# Patient Record
Sex: Female | Born: 1960 | Race: Black or African American | Hispanic: No | Marital: Married | Smoking: Current every day smoker
Health system: Southern US, Community
[De-identification: ages and names within clinical notes are randomized; demographics above are authoritative.]

## PROBLEM LIST (undated history)

## (undated) DIAGNOSIS — I10 Essential (primary) hypertension: Secondary | ICD-10-CM

---

## 1998-10-23 HISTORY — PX: BREAST BIOPSY: SHX20

## 2010-12-08 ENCOUNTER — Ambulatory Visit: Payer: Self-pay | Admitting: Family Medicine

## 2010-12-14 ENCOUNTER — Ambulatory Visit: Payer: Self-pay | Admitting: Family Medicine

## 2015-07-12 ENCOUNTER — Other Ambulatory Visit: Payer: Self-pay

## 2015-07-12 ENCOUNTER — Ambulatory Visit
Admission: EM | Admit: 2015-07-12 | Discharge: 2015-07-12 | Disposition: A | Discharge status: Home / Self Care | Payer: BLUE CROSS/BLUE SHIELD | Attending: Family Medicine | Admitting: Family Medicine

## 2015-07-12 DIAGNOSIS — R002 Palpitations: Secondary | ICD-10-CM

## 2015-07-12 DIAGNOSIS — R001 Bradycardia, unspecified: Secondary | ICD-10-CM | POA: Diagnosis not present

## 2015-07-12 DIAGNOSIS — I10 Essential (primary) hypertension: Secondary | ICD-10-CM | POA: Diagnosis not present

## 2015-07-12 DIAGNOSIS — Z79899 Other long term (current) drug therapy: Secondary | ICD-10-CM | POA: Insufficient documentation

## 2015-07-12 DIAGNOSIS — R112 Nausea with vomiting, unspecified: Secondary | ICD-10-CM | POA: Diagnosis not present

## 2015-07-12 DIAGNOSIS — F17210 Nicotine dependence, cigarettes, uncomplicated: Secondary | ICD-10-CM | POA: Insufficient documentation

## 2015-07-12 HISTORY — DX: Essential (primary) hypertension: I10

## 2015-07-12 LAB — CBC WITH DIFFERENTIAL/PLATELET
Basophils Absolute: 0.1 10*3/uL (ref 0–0.1)
Basophils Relative: 1 %
EOS ABS: 0.1 10*3/uL (ref 0–0.7)
Eosinophils Relative: 1 %
HEMATOCRIT: 40.2 % (ref 35.0–47.0)
HEMOGLOBIN: 13.9 g/dL (ref 12.0–16.0)
LYMPHS ABS: 1.5 10*3/uL (ref 1.0–3.6)
LYMPHS PCT: 25 %
MCH: 33.5 pg (ref 26.0–34.0)
MCHC: 34.5 g/dL (ref 32.0–36.0)
MCV: 97.2 fL (ref 80.0–100.0)
MONOS PCT: 6 %
Monocytes Absolute: 0.4 10*3/uL (ref 0.2–0.9)
NEUTROS ABS: 4.1 10*3/uL (ref 1.4–6.5)
NEUTROS PCT: 67 %
Platelets: 230 10*3/uL (ref 150–440)
RBC: 4.14 MIL/uL (ref 3.80–5.20)
RDW: 12.8 % (ref 11.5–14.5)
WBC: 6.2 10*3/uL (ref 3.6–11.0)

## 2015-07-12 LAB — COMPREHENSIVE METABOLIC PANEL
ALK PHOS: 110 U/L (ref 38–126)
ALT: 24 U/L (ref 14–54)
ANION GAP: 13 (ref 5–15)
AST: 28 U/L (ref 15–41)
Albumin: 4.3 g/dL (ref 3.5–5.0)
BILIRUBIN TOTAL: 0.8 mg/dL (ref 0.3–1.2)
BUN: 15 mg/dL (ref 6–20)
CALCIUM: 10.1 mg/dL (ref 8.9–10.3)
CO2: 23 mmol/L (ref 22–32)
CREATININE: 0.97 mg/dL (ref 0.44–1.00)
Chloride: 103 mmol/L (ref 101–111)
Glucose, Bld: 85 mg/dL (ref 65–99)
Potassium: 4.2 mmol/L (ref 3.5–5.1)
Sodium: 139 mmol/L (ref 135–145)
TOTAL PROTEIN: 8.5 g/dL — AB (ref 6.5–8.1)

## 2015-07-12 LAB — MAGNESIUM: Magnesium: 2.1 mg/dL (ref 1.7–2.4)

## 2015-07-12 MED ORDER — ONDANSETRON 8 MG PO TBDP
8.0000 mg | ORAL_TABLET | Freq: Once | ORAL | Status: AC
  Administered 2015-07-12: 8 mg via ORAL

## 2015-07-12 MED ORDER — HYDROCHLOROTHIAZIDE 12.5 MG PO TABS: ORAL_TABLET | ORAL | Status: AC

## 2015-07-12 MED ORDER — HYDROCHLOROTHIAZIDE 12.5 MG PO TABS: ORAL_TABLET | ORAL | Status: DC

## 2015-07-12 MED ORDER — AMLODIPINE BESYLATE 5 MG PO TABS: ORAL_TABLET | ORAL | Status: AC

## 2015-07-12 MED ORDER — ONDANSETRON 8 MG PO TBDP: 8.0000 mg | ORAL_TABLET | Freq: Three times a day (TID) | ORAL | Status: AC | PRN

## 2015-07-12 MED ORDER — AMLODIPINE BESYLATE 10 MG PO TABS: ORAL_TABLET | ORAL | Status: DC

## 2015-07-12 NOTE — Discharge Instructions (Signed)
Bradycardia Bradycardia is a term for a heart rate (pulse) that, in adults, is slower than 60 beats per minute. A normal rate is 60 to 100 beats per minute. A heart rate below 60 beats per minute may be normal for some adults with healthy hearts. If the rate is too slow, the heart may have trouble pumping the volume of blood the body needs. If the heart rate gets too low, blood flow to the brain may be decreased and may make you feel lightheaded, dizzy, or faint. The heart has a natural pacemaker in the top of the heart called the SA node (sinoatrial or sinus node). This pacemaker sends out regular electrical signals to the muscle of the heart, telling the heart muscle when to beat (contract). The electrical signal travels from the upper parts of the heart (atria) through the AV node (atrioventricular node), to the lower chambers of the heart (ventricles). The ventricles squeeze, pumping the blood from your heart to your lungs and to the rest of your body. CAUSES   Problem with the heart's electrical system.  Problem with the heart's natural pacemaker.  Heart disease, damage, or infection.  Medications.  Problems with minerals and salts (electrolytes). SYMPTOMS   Fainting (syncope).  Fatigue and weakness.  Shortness of breath (dyspnea).  Chest pain (angina).  Drowsiness.  Confusion. DIAGNOSIS   An electrocardiogram (ECG) can help your caregiver determine the type of slow heart rate you have.  If the cause is not seen on an ECG, you may need to wear a heart monitor that records your heart rhythm for several hours or days.  Blood tests. TREATMENT   Electrolyte supplements.  Medications.  Withholding medication which is causing a slow heart rate.  Pacemaker placement. SEEK IMMEDIATE MEDICAL CARE IF:   You feel lightheaded or faint.  You develop an irregular heart rate.  You feel chest pain or have trouble breathing. MAKE SURE YOU:   Understand these  instructions.  Will watch your condition.  Will get help right away if you are not doing well or get worse. Document Released: 07/01/2002 Document Revised: 01/01/2012 Document Reviewed: 01/14/2014 [facility] Patient Information 2015 [place], [place]. This information is not intended to replace advice given to you by your health care provider. Make sure you discuss any questions you have with your health care provider.  How to Take Your Blood Pressure HOW DO I GET A BLOOD PRESSURE MACHINE?  You can buy an electronic home blood pressure machine at your local pharmacy. Insurance will sometimes cover the cost if you have a prescription.  Ask your doctor what type of machine is best for you. There are different machines for your arm and your wrist.  If you decide to buy a machine to check your blood pressure on your arm, first check the size of your arm so you can buy the right size cuff. To check the size of your arm:   Use a measuring tape that shows both inches and centimeters.   Wrap the measuring tape around the upper-middle part of your arm. You may need someone to help you measure.   Write down your arm measurement in both inches and centimeters.   To measure your blood pressure correctly, it is important to have the right size cuff.   If your arm is up to 13 inches (up to 34 centimeters), get an adult cuff size.  If your arm is 13 to 17 inches (35 to 44 centimeters), get a large adult cuff size.  If your arm is 17 to 20 inches (45 to 52 centimeters), get an adult thigh cuff.  WHAT DO THE NUMBERS MEAN?   There are two numbers that make up your blood pressure. For example: 120/80.  The first number (120 in our example) is called the "systolic pressure." It is a measure of the pressure in your blood vessels when your heart is pumping blood.  The second number (80 in our example) is called the "diastolic pressure." It is a measure of the pressure in your blood vessels when  your heart is resting between beats.  Your doctor will tell you what your blood pressure should be. WHAT SHOULD I DO BEFORE I CHECK MY BLOOD PRESSURE?   Try to rest or relax for at least 30 minutes before you check your blood pressure.  Do not smoke.  Do not have any drinks with caffeine, such as:  Soda.  Coffee.  Tea.  Check your blood pressure in a quiet room.  Sit down and stretch out your arm on a table. Keep your arm at about the level of your heart. Let your arm relax.  Make sure that your legs are not crossed. HOW DO I CHECK MY BLOOD PRESSURE?  Follow the directions that came with your machine.  Make sure you remove any tight-fighting clothing from your arm or wrist. Wrap the cuff around your upper arm or wrist. You should be able to fit a finger between the cuff and your arm. If you cannot fit a finger between the cuff and your arm, it is too tight and should be removed and rewrapped.  Some units require you to manually pump up the arm cuff.  Automatic units inflate the cuff when you press a button.  Cuff deflation is automatic in both models.  After the cuff is inflated, the unit measures your blood pressure and pulse. The readings are shown on a monitor. Hold still and breathe normally while the cuff is inflated.  Getting a reading takes less than a minute.  Some models store readings in a memory. Some provide a printout of readings. If your machine does not store your readings, keep a written record.  Take readings with you to your next visit with your doctor. Document Released: 09/21/2008 Document Revised: 02/23/2014 Document Reviewed: 12/04/2013 [facility] Patient Information 2015 [place], [place]. This information is not intended to replace advice given to you by your health care provider. Make sure you discuss any questions you have with your health care provider.  Hypertension Hypertension is another name for high blood pressure. High blood pressure forces  your heart to work harder to pump blood. A blood pressure reading has two numbers, which includes a higher number over a lower number (example: 110/72). HOME CARE   Have your blood pressure rechecked by your doctor.  Only take medicine as told by your doctor. Follow the directions carefully. The medicine does not work as well if you skip doses. Skipping doses also puts you at risk for problems.  Do not smoke.  Monitor your blood pressure at home as told by your doctor. GET HELP IF:  You think you are having a reaction to the medicine you are taking.  You have repeat headaches or feel dizzy.  You have puffiness (swelling) in your ankles.  You have trouble with your vision. GET HELP RIGHT AWAY IF:   You get a very bad headache and are confused.  You feel weak, numb, or faint.  You get chest or  belly (abdominal) pain.  You throw up (vomit).  You cannot breathe very well. MAKE SURE YOU:   Understand these instructions.  Will watch your condition.  Will get help right away if you are not doing well or get worse. Document Released: 03/27/2008 Document Revised: 10/14/2013 Document Reviewed: 08/01/2013 [facility] Patient Information 2015 [place], [place]. This information is not intended to replace advice given to you by your health care provider. Make sure you discuss any questions you have with your health care provider.  Managing Your High Blood Pressure Blood pressure is a measurement of how forceful your blood is pressing against the walls of the arteries. Arteries are muscular tubes within the circulatory system. Blood pressure does not stay the same. Blood pressure rises when you are active, excited, or nervous; and it lowers during sleep and relaxation. If the numbers measuring your blood pressure stay above normal most of the time, you are at risk for health problems. High blood pressure (hypertension) is a long-term (chronic) condition in which blood pressure is elevated. A  blood pressure reading is recorded as two numbers, such as 120 over 80 (or 120/80). The first, higher number is called the systolic pressure. It is a measure of the pressure in your arteries as the heart beats. The second, lower number is called the diastolic pressure. It is a measure of the pressure in your arteries as the heart relaxes between beats.  Keeping your blood pressure in a normal range is important to your overall health and prevention of health problems, such as heart disease and stroke. When your blood pressure is uncontrolled, your heart has to work harder than normal. High blood pressure is a very common condition in adults because blood pressure tends to rise with age. Men and women are equally likely to have hypertension but at different times in life. Before age 20, men are more likely to have hypertension. After 55 years of age, women are more likely to have it. Hypertension is especially common in African Americans. This condition often has no signs or symptoms. The cause of the condition is usually not known. Your caregiver can help you come up with a plan to keep your blood pressure in a normal, healthy range. BLOOD PRESSURE STAGES Blood pressure is classified into four stages: normal, prehypertension, stage 1, and stage 2. Your blood pressure reading will be used to determine what type of treatment, if any, is necessary. Appropriate treatment options are tied to these four stages:  Normal  Systolic pressure (mm Hg): below 120.  Diastolic pressure (mm Hg): below 80. Prehypertension  Systolic pressure (mm Hg): 120 to 139.  Diastolic pressure (mm Hg): 80 to 89. Stage1  Systolic pressure (mm Hg): 140 to 159.  Diastolic pressure (mm Hg): 90 to 99. Stage2  Systolic pressure (mm Hg): 160 or above.  Diastolic pressure (mm Hg): 100 or above. RISKS RELATED TO HIGH BLOOD PRESSURE Managing your blood pressure is an important responsibility. Uncontrolled high blood pressure can  lead to:  A heart attack.  A stroke.  A weakened blood vessel (aneurysm).  Heart failure.  Kidney damage.  Eye damage.  Metabolic syndrome.  Memory and concentration problems. HOW TO MANAGE YOUR BLOOD PRESSURE Blood pressure can be managed effectively with lifestyle changes and medicines (if needed). Your caregiver will help you come up with a plan to bring your blood pressure within a normal range. Your plan should include the following: Education  Read all information provided by your caregivers about how to  control blood pressure.  Educate yourself on the latest guidelines and treatment recommendations. New research is always being done to further define the risks and treatments for high blood pressure. Lifestylechanges  Control your weight.  Avoid smoking.  Stay physically active.  Reduce the amount of salt in your diet.  Reduce stress.  Control any chronic conditions, such as high cholesterol or diabetes.  Reduce your alcohol intake. Medicines  Several medicines (antihypertensive medicines) are available, if needed, to bring blood pressure within a normal range. Communication  Review all the medicines you take with your caregiver because there may be side effects or interactions.  Talk with your caregiver about your diet, exercise habits, and other lifestyle factors that may be contributing to high blood pressure.  See your caregiver regularly. Your caregiver can help you create and adjust your plan for managing high blood pressure. RECOMMENDATIONS FOR TREATMENT AND FOLLOW-UP  The following recommendations are based on current guidelines for managing high blood pressure in nonpregnant adults. Use these recommendations to identify the proper follow-up period or treatment option based on your blood pressure reading. You can discuss these options with your caregiver.  Systolic pressure of 120 to 139 or diastolic pressure of 80 to 89: Follow up with your  caregiver as directed.  Systolic pressure of 140 to 160 or diastolic pressure of 90 to 100: Follow up with your caregiver within 2 months.  Systolic pressure above 160 or diastolic pressure above 100: Follow up with your caregiver within 1 month.  Systolic pressure above 180 or diastolic pressure above 110: Consider antihypertensive therapy; follow up with your caregiver within 1 week.  Systolic pressure above 200 or diastolic pressure above 120: Begin antihypertensive therapy; follow up with your caregiver within 1 week. Document Released: 07/03/2012 Document Reviewed: 07/03/2012 [facility] Patient Information 2015 [place], [place]. This information is not intended to replace advice given to you by your health care provider. Make sure you discuss any questions you have with your health care provider.

## 2015-07-12 NOTE — ED Notes (Signed)
Pt states my nausea is better, I just feel hungry now.

## 2015-07-12 NOTE — ED Notes (Signed)
Pt states "I haven't taken my blood pressure since March. I been exercising and cutting back the salt, it was been in the 140's. Yesterday I had a bad day and I developed a headache and vomiting. Today I still have a headache, and my blood pressure at home last night was 175/112.  I have not checked it this morning.

## 2015-07-12 NOTE — ED Provider Notes (Signed)
CSN: [id number]     Arrival date & time 07/12/15  0859 History   First MD Initiated Contact with Patient 07/12/15 1007     Chief Complaint  Patient presents with  . Hypertension   (Consider location/radiation/quality/duration/timing/severity/associated sxs/prior Treatment) Patient is a 54 y.o. female presenting with hypertension. The history is provided by the patient and a significant other. No language interpreter was used.  Hypertension This is a chronic problem. The problem occurs constantly. Associated symptoms include headaches. Pertinent negatives include no chest pain, no abdominal pain and no shortness of breath. The symptoms are aggravated by eating. Nothing relieves the symptoms. She has tried nothing for the symptoms.   patient reports difficulty taking her blood pressure medicine for over the last 4 months. She missed being noncompliant stateside core thiazide 12.5 seemed to be too strong for her she was having cramping of her muscles and drying of her muscles as well. She admits though that she's not really lost much weight but felt toner muscles since she started with exercises. She is still smoking. She denies chest pain yesterday but reports having a headache and some palpitations. Question when the last blood work she obtained she can't really say itself is been years since she's had complete physical.   Poor several episodes of vomiting and a nuclear anything down yesterday she still has some nausea today but not nearly as bad as yesterday.  Past Medical History  Diagnosis Date  . Hypertension    History reviewed. No pertinent past surgical history. History reviewed. No pertinent family history. Social History  Substance Use Topics  . Smoking status: Current Every Day Smoker  . Smokeless tobacco: None  . Alcohol Use: No   OB History    No data available     Review of Systems  Constitutional: Positive for fatigue.  Respiratory: Negative for shortness of breath.    Cardiovascular: Positive for palpitations. Negative for chest pain.  Gastrointestinal: Positive for nausea. Negative for abdominal pain and abdominal distention.  Neurological: Positive for headaches.  All other systems reviewed and are negative.  nurse's notes were reviewed patient does still smoke  Allergies  Review of patient's allergies indicates no known allergies.  Home Medications   Prior to Admission medications   Medication Sig Start Date End Date Taking? Authorizing Provider  amLODipine (NORVASC) 5 MG tablet 1/2-1 tablet daily until follow-up PCP 07/12/15   Hassan Rowan, MD  hydrochlorothiazide (HYDRODIURIL) 12.5 MG tablet Take 1 1:30 tablet daily until follow-up with PCP 07/12/15   Hassan Rowan, MD  ondansetron (ZOFRAN ODT) 8 MG disintegrating tablet Take 1 tablet (8 mg total) by mouth every 8 (eight) hours as needed for nausea or vomiting. 07/12/15   Hassan Rowan, MD   Meds Ordered and Administered this Visit   Medications  ondansetron (ZOFRAN-ODT) disintegrating tablet 8 mg (8 mg Oral Given 07/12/15 1105)    BP 142/92 mmHg  Pulse 57  Temp(Src) 98.4 F (36.9 C) (Tympanic)  Resp 16  Ht  (1.6 m)  Wt 128 lb (58.06 kg)  BMI 22.68 kg/m2  SpO2 98%  LMP  No data found.   Physical Exam  Constitutional: She is oriented to person, place, and time. She appears well-developed and well-nourished. She appears distressed.  HENT:  Head: Normocephalic and atraumatic.  Right Ear: External ear normal.  Left Ear: External ear normal.  Mouth/Throat: Oropharynx is clear and moist.  Eyes: Conjunctivae are normal. Pupils are equal, round, and reactive to light.  Neck: Normal  range of motion. Neck supple.  Cardiovascular: Normal rate, regular rhythm and normal heart sounds.   Pulmonary/Chest: Effort normal and breath sounds normal. No respiratory distress.  Abdominal: Soft.  Musculoskeletal: Normal range of motion.  Neurological: She is alert and oriented to person, place, and  time.  Skin: Skin is warm and dry. She is not diaphoretic.  Psychiatric: She has a normal mood and affect.  Vitals reviewed.   ED Course  Procedures (including critical care time)  Labs Review Labs Reviewed  COMPREHENSIVE METABOLIC PANEL - Abnormal; Notable for the following:    Total Protein 8.5 (*)    All other components within normal limits  CBC WITH DIFFERENTIAL/PLATELET  MAGNESIUM    Imaging Review No results found.   Visual Acuity Review  Right Eye Distance:   Left Eye Distance:   Bilateral Distance:    Right Eye Near:   Left Eye Near:    Bilateral Near:      EKG shows sinus bradycardia left axis deviation and incomplete left bundle branch block with nonspecific T-wave abnormalities.  Results for orders placed or performed during the hospital encounter of 07/12/15  CBC with Differential  Result Value Ref Range   WBC 6.2 3.6 - 11.0 K/uL   RBC 4.14 3.80 - 5.20 MIL/uL   Hemoglobin 13.9 12.0 - 16.0 g/dL   HCT 40.9 81.1 - 91.4 %   MCV 97.2 80.0 - 100.0 fL   MCH 33.5 26.0 - 34.0 pg   MCHC 34.5 32.0 - 36.0 g/dL   RDW 78.2 95.6 - 21.3 %   Platelets 230 150 - 440 K/uL   Neutrophils Relative % 67 %   Neutro Abs 4.1 1.4 - 6.5 K/uL   Lymphocytes Relative 25 %   Lymphs Abs 1.5 1.0 - 3.6 K/uL   Monocytes Relative 6 %   Monocytes Absolute 0.4 0.2 - 0.9 K/uL   Eosinophils Relative 1 %   Eosinophils Absolute 0.1 0 - 0.7 K/uL   Basophils Relative 1 %   Basophils Absolute 0.1 0 - 0.1 K/uL  Comprehensive metabolic panel  Result Value Ref Range   Sodium 139 135 - 145 mmol/L   Potassium 4.2 3.5 - 5.1 mmol/L   Chloride 103 101 - 111 mmol/L   CO2 23 22 - 32 mmol/L   Glucose, Bld 85 65 - 99 mg/dL   BUN 15 6 - 20 mg/dL   Creatinine, Ser 0.86 0.44 - 1.00 mg/dL   Calcium 57.8 8.9 - 46.9 mg/dL   Total Protein 8.5 (H) 6.5 - 8.1 g/dL   Albumin 4.3 3.5 - 5.0 g/dL   AST 28 15 - 41 U/L   ALT 24 14 - 54 U/L   Alkaline Phosphatase 110 38 - 126 U/L   Total Bilirubin 0.8 0.3  - 1.2 mg/dL   GFR calc non Af Amer >60 >60 mL/min   GFR calc Af Amer >60 >60 mL/min   Anion gap 13 5 - 15  Magnesium  Result Value Ref Range   Magnesium 2.1 1.7 - 2.4 mg/dL   MDM   1. Essential hypertension   2. Non-intractable vomiting with nausea, vomiting of unspecified type   3. Heart palpitations   4. Bradycardia     We talked at length about the need to take blood pressure medicine that stopped suddenly if the 12th 0.5 mg topical thousand constantly problems out reduces 6.25 consider eating some bananas drinking on issues. They will concern about restarting her amlodipine at 10 mg for  clinic break in half/5 mg with half tablet "hydrochlorothiazide until she can see her PCP.     Hassan Rowan, MD 07/12/15 [phone]

## 2015-07-21 ENCOUNTER — Other Ambulatory Visit: Payer: Self-pay | Admitting: Nurse Practitioner

## 2015-07-21 DIAGNOSIS — Z1231 Encounter for screening mammogram for malignant neoplasm of breast: Secondary | ICD-10-CM

## 2015-07-27 ENCOUNTER — Ambulatory Visit: Payer: BLUE CROSS/BLUE SHIELD

## 2015-08-04 ENCOUNTER — Ambulatory Visit
Admission: RE | Admit: 2015-08-04 | Discharge: 2015-08-04 | Disposition: A | Discharge status: Home / Self Care | Payer: BLUE CROSS/BLUE SHIELD | Source: Ambulatory Visit | Attending: Nurse Practitioner | Admitting: Nurse Practitioner

## 2015-08-04 DIAGNOSIS — Z1231 Encounter for screening mammogram for malignant neoplasm of breast: Secondary | ICD-10-CM | POA: Diagnosis present

## 2016-09-11 ENCOUNTER — Other Ambulatory Visit: Payer: Self-pay | Admitting: Nurse Practitioner

## 2016-09-11 DIAGNOSIS — Z1231 Encounter for screening mammogram for malignant neoplasm of breast: Secondary | ICD-10-CM

## 2016-11-03 ENCOUNTER — Other Ambulatory Visit: Payer: Self-pay | Admitting: Student

## 2016-11-03 DIAGNOSIS — R768 Other specified abnormal immunological findings in serum: Secondary | ICD-10-CM

## 2017-01-01 ENCOUNTER — Ambulatory Visit: Payer: BLUE CROSS/BLUE SHIELD

## 2017-01-05 ENCOUNTER — Ambulatory Visit
Admission: RE | Admit: 2017-01-05 | Discharge: 2017-01-05 | Disposition: A | Discharge status: Home / Self Care | Payer: Managed Care, Other (non HMO) | Source: Ambulatory Visit | Attending: Student | Admitting: Student

## 2017-01-05 DIAGNOSIS — R768 Other specified abnormal immunological findings in serum: Secondary | ICD-10-CM | POA: Diagnosis not present

## 2019-09-03 ENCOUNTER — Other Ambulatory Visit: Payer: Self-pay | Admitting: Nurse Practitioner

## 2019-09-03 DIAGNOSIS — Z1231 Encounter for screening mammogram for malignant neoplasm of breast: Secondary | ICD-10-CM

## 2019-09-05 ENCOUNTER — Ambulatory Visit
Admission: RE | Admit: 2019-09-05 | Discharge: 2019-09-05 | Disposition: A | Discharge status: Home / Self Care | Payer: Managed Care, Other (non HMO) | Source: Ambulatory Visit | Attending: Nurse Practitioner | Admitting: Nurse Practitioner

## 2019-09-05 DIAGNOSIS — Z1231 Encounter for screening mammogram for malignant neoplasm of breast: Secondary | ICD-10-CM | POA: Diagnosis present

## 2019-09-16 ENCOUNTER — Other Ambulatory Visit: Payer: Self-pay | Admitting: Nurse Practitioner

## 2019-09-16 ENCOUNTER — Inpatient Hospital Stay
Admission: RE | Admit: 2019-09-16 | Discharge: 2019-09-16 | Disposition: A | Discharge status: Home / Self Care | Payer: Self-pay | Source: Ambulatory Visit | Attending: Nurse Practitioner | Admitting: Nurse Practitioner

## 2019-09-16 DIAGNOSIS — Z1231 Encounter for screening mammogram for malignant neoplasm of breast: Secondary | ICD-10-CM

## 2019-11-10 ENCOUNTER — Ambulatory Visit: Payer: Managed Care, Other (non HMO) | Attending: Internal Medicine

## 2019-11-10 DIAGNOSIS — Z20822 Contact with and (suspected) exposure to covid-19: Secondary | ICD-10-CM

## 2019-11-11 LAB — NOVEL CORONAVIRUS, NAA: SARS-CoV-2, NAA: NOT DETECTED

## 2020-04-23 ENCOUNTER — Emergency Department: Payer: Managed Care, Other (non HMO)

## 2020-04-23 ENCOUNTER — Encounter: Payer: Self-pay | Admitting: Emergency Medicine

## 2020-04-23 ENCOUNTER — Other Ambulatory Visit: Payer: Self-pay

## 2020-04-23 ENCOUNTER — Emergency Department
Admission: EM | Admit: 2020-04-23 | Discharge: 2020-04-23 | Disposition: A | Discharge status: Home / Self Care | Payer: Managed Care, Other (non HMO) | Attending: Emergency Medicine | Admitting: Emergency Medicine

## 2020-04-23 DIAGNOSIS — S43005A Unspecified dislocation of left shoulder joint, initial encounter: Secondary | ICD-10-CM

## 2020-04-23 DIAGNOSIS — F17200 Nicotine dependence, unspecified, uncomplicated: Secondary | ICD-10-CM | POA: Insufficient documentation

## 2020-04-23 DIAGNOSIS — S4992XA Unspecified injury of left shoulder and upper arm, initial encounter: Secondary | ICD-10-CM | POA: Diagnosis present

## 2020-04-23 DIAGNOSIS — W548XXA Other contact with dog, initial encounter: Secondary | ICD-10-CM | POA: Insufficient documentation

## 2020-04-23 DIAGNOSIS — Y929 Unspecified place or not applicable: Secondary | ICD-10-CM | POA: Insufficient documentation

## 2020-04-23 DIAGNOSIS — S42252A Displaced fracture of greater tuberosity of left humerus, initial encounter for closed fracture: Secondary | ICD-10-CM | POA: Diagnosis not present

## 2020-04-23 DIAGNOSIS — I10 Essential (primary) hypertension: Secondary | ICD-10-CM | POA: Insufficient documentation

## 2020-04-23 DIAGNOSIS — Y999 Unspecified external cause status: Secondary | ICD-10-CM | POA: Diagnosis not present

## 2020-04-23 DIAGNOSIS — Y93K1 Activity, walking an animal: Secondary | ICD-10-CM | POA: Diagnosis not present

## 2020-04-23 LAB — CBC WITH DIFFERENTIAL/PLATELET
Abs Immature Granulocytes: 0.03 10*3/uL (ref 0.00–0.07)
Basophils Absolute: 0.1 10*3/uL (ref 0.0–0.1)
Basophils Relative: 1 %
Eosinophils Absolute: 0.1 10*3/uL (ref 0.0–0.5)
Eosinophils Relative: 0 %
HCT: 38.8 % (ref 36.0–46.0)
Hemoglobin: 13.4 g/dL (ref 12.0–15.0)
Immature Granulocytes: 0 %
Lymphocytes Relative: 9 %
Lymphs Abs: 1 10*3/uL (ref 0.7–4.0)
MCH: 33.3 pg (ref 26.0–34.0)
MCHC: 34.5 g/dL (ref 30.0–36.0)
MCV: 96.5 fL (ref 80.0–100.0)
Monocytes Absolute: 0.7 10*3/uL (ref 0.1–1.0)
Monocytes Relative: 6 %
Neutro Abs: 9.9 10*3/uL — ABNORMAL HIGH (ref 1.7–7.7)
Neutrophils Relative %: 84 %
Platelets: 232 10*3/uL (ref 150–400)
RBC: 4.02 MIL/uL (ref 3.87–5.11)
RDW: 12.7 % (ref 11.5–15.5)
WBC: 11.7 10*3/uL — ABNORMAL HIGH (ref 4.0–10.5)
nRBC: 0 % (ref 0.0–0.2)

## 2020-04-23 LAB — BASIC METABOLIC PANEL
Anion gap: 12 (ref 5–15)
BUN: 14 mg/dL (ref 6–20)
CO2: 25 mmol/L (ref 22–32)
Calcium: 9.5 mg/dL (ref 8.9–10.3)
Chloride: 101 mmol/L (ref 98–111)
Creatinine, Ser: 1.01 mg/dL — ABNORMAL HIGH (ref 0.44–1.00)
GFR calc Af Amer: >60 mL/min (ref >60)
GFR calc non Af Amer: >60 mL/min (ref >60)
Glucose, Bld: 107 mg/dL — ABNORMAL HIGH (ref 70–99)
Potassium: 3.4 mmol/L — ABNORMAL LOW (ref 3.5–5.1)
Sodium: 138 mmol/L (ref 135–145)

## 2020-04-23 MED ORDER — OXYCODONE HCL 5 MG PO TABS: 5.0000 mg | ORAL_TABLET | Freq: Three times a day (TID) | ORAL | 0 refills | Status: AC | PRN

## 2020-04-23 MED ORDER — IOHEXOL 350 MG/ML SOLN
100.0000 mL | Freq: Once | INTRAVENOUS | Status: AC | PRN
  Administered 2020-04-23: 100 mL via INTRAVENOUS

## 2020-04-23 MED ORDER — PROPOFOL 10 MG/ML IV BOLUS
INTRAVENOUS | Status: AC | PRN
  Administered 2020-04-23: 30 mg via INTRAVENOUS

## 2020-04-23 MED ORDER — PROPOFOL 10 MG/ML IV BOLUS
0.5000 mg/kg | Freq: Once | INTRAVENOUS | Status: DC
  Filled 2020-04-23: qty 20

## 2020-04-23 MED ORDER — KETAMINE HCL 10 MG/ML IJ SOLN
INTRAMUSCULAR | Status: AC | PRN
  Administered 2020-04-23: 30 mg via INTRAVENOUS

## 2020-04-23 MED ORDER — KETAMINE HCL 10 MG/ML IJ SOLN
0.5000 mg/kg | Freq: Once | INTRAMUSCULAR | Status: DC
  Filled 2020-04-23: qty 1

## 2020-04-23 NOTE — Sedation Documentation (Signed)
Pt's husband back to bedside at this time. Pt noted to be arousable on calling at this time. Pt states she has no sensation to L arm, despite having +2 radial pulses at this time. Pt provided with 2 warm blankets at this time.

## 2020-04-23 NOTE — Sedation Documentation (Signed)
Sling applied to L shoulder by this RN, Herbert Seta, RN, Dr. Fuller Plan, and Dr. Roxan Hockey.

## 2020-04-23 NOTE — Sedation Documentation (Signed)
EDP made aware that patient states she can't feel her L arm at this time.

## 2020-04-23 NOTE — ED Notes (Signed)
Consent obtained by this RN, moderate D/C D/C instructions obtained by this RN and signed by patient, paper copy placed in chart.

## 2020-04-23 NOTE — ED Notes (Signed)
EDP at bedside to assess patient. Pt states she is able to move her shoulder at this time, is unable to move wrist. Pt c/o tingling from elbow down.

## 2020-04-23 NOTE — Discharge Instructions (Addendum)
Take Tylenol 1 g every 8 hours and ibuprofen 600 every 6 hours with food.  Take the oxycodone for breakthrough pain.  Do not drive while on this.  It can increased risk for fall so be careful.  Return the ER if you develop coolness in the hand, increasing pain or any other concerns.  Otherwise you should leave the shoulder sling on and follow-up with the orthopedic surgeon.   IMPRESSION: Anterior dislocation of proximal left humerus, with moderately displaced fracture of greater tuberosity.

## 2020-04-23 NOTE — Sedation Documentation (Signed)
X-ray at bedside for repeat X-ray

## 2020-04-23 NOTE — ED Provider Notes (Signed)
.  Sedation  Date/Time: 04/23/2020 3:06 PM Performed by: Willy Eddy, MD Authorized by: Willy Eddy, MD   Consent:    Consent obtained:  Written (electronic informed consent)   Consent given by:  Patient   Risks discussed:  Allergic reaction, dysrhythmia, inadequate sedation, nausea, vomiting, respiratory compromise necessitating ventilatory assistance and intubation, prolonged sedation necessitating reversal and prolonged hypoxia resulting in organ damage Universal protocol:    Procedure explained and questions answered to patient or proxy's satisfaction: yes     Relevant documents present and verified: yes     Test results available and properly labeled: yes     Imaging studies available: yes     Required blood products, implants, devices, and special equipment available: yes     Immediately prior to procedure a time out was called: yes     Patient identity confirmation method:  Arm band Indications:    Procedure performed:  Dislocation reduction   Procedure necessitating sedation performed by:  Different physician Pre-sedation assessment:    Time since last food or drink:  4   ASA classification: class 1 - normal, healthy patient     Neck mobility: normal     Mouth opening:  3 or more finger widths   Thyromental distance:  4 finger widths   Mallampati score:  I - soft palate, uvula, fauces, pillars visible   Pre-sedation assessments completed and reviewed: airway patency, cardiovascular function, hydration status, mental status, nausea/vomiting, pain level, respiratory function and temperature   Immediate pre-procedure details:    Reassessment: Patient reassessed immediately prior to procedure     Reviewed: vital signs, relevant labs/tests and NPO status     Verified: bag valve mask available, emergency equipment available, intubation equipment available, IV patency confirmed, oxygen available, reversal medications available and suction available   Procedure details (see  MAR for exact dosages):    Sedation:  Propofol and ketamine   Intended level of sedation: deep   Intra-procedure monitoring:  Blood pressure monitoring, continuous pulse oximetry, cardiac monitor, frequent vital sign checks and frequent LOC assessments   Intra-procedure events: none     Total Provider sedation time (minutes):  5 Post-procedure details:    Attendance: Constant attendance by certified staff until patient recovered     Recovery: Patient returned to pre-procedure baseline     Post-sedation assessments completed and reviewed: airway patency, cardiovascular function, hydration status, mental status and respiratory function     Patient is stable for discharge or admission: yes     Patient tolerance:  Tolerated well, no immediate complications      Willy Eddy, MD 04/23/20 1507

## 2020-04-23 NOTE — ED Triage Notes (Signed)
Fall today while walking dog.  Fell onto left side with hand extended.  C/O pain to left shoulder.  Also c/o numbness and tingling to left hand.    + radial pulse palpable and equally strong to right radial pulse.  No obvious deformity.

## 2020-04-23 NOTE — ED Provider Notes (Signed)
[facility] Emergency Department Provider Note  ____________________________________________   First MD Initiated Contact with Patient 04/23/20 1413     (approximate)  I have reviewed the triage vital signs and the nursing notes.   HISTORY  Chief Complaint Fall    HPI Jacqueline Hines is a 59 y.o. female with hypertension who comes in for a fall.  Patient states that she was walking her dog today around noon when she fell with her arm extended up over her head.  She had pain to her left shoulder.  Patient reports numbness into into the left hand and not being able to move her hand but she does not really have any pain in the hand.  The pain is more in the shoulder.  The pain is severe, constant, worse with moving around, better at rest.  She states that she did not hit her head did not lose consciousness.  Denies any cervical spine pain or other extremity pain.  No chest wall pain or abdominal pain          Past Medical History:  Diagnosis Date  . Hypertension     There are no problems to display for this patient.   Past Surgical History:  Procedure Laterality Date  . BREAST BIOPSY Right 2000   needle bx-neg-     Prior to Admission medications   Medication Sig Start Date End Date Taking? Authorizing Provider  amLODipine (NORVASC) 5 MG tablet 1/2-1 tablet daily until follow-up PCP 07/12/15   Hassan Rowan, MD  hydrochlorothiazide (HYDRODIURIL) 12.5 MG tablet Take 1 1:30 tablet daily until follow-up with PCP 07/12/15   Hassan Rowan, MD  ondansetron (ZOFRAN ODT) 8 MG disintegrating tablet Take 1 tablet (8 mg total) by mouth every 8 (eight) hours as needed for nausea or vomiting. 07/12/15   Hassan Rowan, MD    Allergies Patient has no known allergies.  No family history on file.  Social History Social History   Tobacco Use  . Smoking status: Current Every Day Smoker  . Smokeless tobacco: Never Used  Substance Use Topics  . Alcohol  use: No  . Drug use: Not on file      Review of Systems Constitutional: No fever/chills Eyes: No visual changes. ENT: No sore throat. Cardiovascular: Denies chest pain. Respiratory: Denies shortness of breath. Gastrointestinal: No abdominal pain.  No nausea, no vomiting.  No diarrhea.  No constipation. Genitourinary: Negative for dysuria. Musculoskeletal: Negative for back pain.  Left shoulder pain Skin: Negative for rash. Neurological: Negative for headaches, focal weakness or numbness. All other ROS negative ____________________________________________   PHYSICAL EXAM:  VITAL SIGNS: ED Triage Vitals  Enc Vitals Group     BP 04/23/20 1340 (!) 150/90     Pulse Rate 04/23/20 1340 69     Resp 04/23/20 1340 16     Temp 04/23/20 1340 97.7 F (36.5 C)     Temp Source 04/23/20 1340 Oral     SpO2 04/23/20 1340 100 %     Weight 04/23/20 1338 128 lb 1.4 oz (58.1 kg)     Height 04/23/20 1338 5\' 3"  (1.6 m)     Head Circumference --      Peak Flow --      Pain Score 04/23/20 1337 10     Pain Loc --      Pain Edu? --      Excl. in GC? --     Constitutional: Alert and oriented. Well appearing and in no  acute distress. Eyes: Conjunctivae are normal. EOMI. Head: Atraumatic. Nose: No congestion/rhinnorhea. Mouth/Throat: Mucous membranes are moist.   Neck: No stridor. Trachea Midline. FROM.  No C-spine tenderness Cardiovascular: Normal rate, regular rhythm. Grossly normal heart sounds.  Good peripheral circulation. Respiratory: Normal respiratory effort.  No retractions. Lungs CTAB. Gastrointestinal: Soft and nontender. No distention. No abdominal bruits.  Musculoskeletal: Patient has deformity noted to the left shoulder.  Distal pulse appears intact but maybe weaker then compared to other side.  She is got inability to dorsiflex or plantarflex her wrist.  She states that she has tingling/ numbness sensation throughout the entire hand not distributed to one nerve root. Neurologic:   Normal speech and language. No gross focal neurologic deficits are appreciated.  Skin:  Skin is warm, dry and intact. No rash noted. Psychiatric: Mood and affect are normal. Speech and behavior are normal. GU: Deferred   ____________________________________________   LABS (all labs ordered are listed, but only abnormal results are displayed)  Labs Reviewed  CBC WITH DIFFERENTIAL/PLATELET - Abnormal; Notable for the following components:      Result Value   WBC 11.7 (*)    Neutro Abs 9.9 (*)    All other components within normal limits  BASIC METABOLIC PANEL - Abnormal; Notable for the following components:   Potassium 3.4 (*)    Glucose, Bld 107 (*)    Creatinine, Ser 1.01 (*)    All other components within normal limits   ____________________________________________  RADIOLOGY Vela Prose, personally viewed and evaluated these images (plain radiographs) as part of my medical decision making, as well as reviewing the written report by the radiologist.  ED MD interpretation: Patient has a dislocated humerus with greater tuberosity fracture   X-ray after reduction is back in place  Official radiology report(s): DG Shoulder Left  Result Date: 04/23/2020 CLINICAL DATA:  Left shoulder pain after fall. EXAM: LEFT SHOULDER - 2+ VIEW COMPARISON:  None. FINDINGS: Anterior dislocation of proximal left humerus is noted. Moderately displaced fracture is seen involving the greater tuberosity. Visualized ribs are unremarkable. IMPRESSION: Anterior dislocation of proximal left humerus, with moderately displaced fracture of greater tuberosity. Electronically Signed   By: Lupita Raider M.D.   On: 04/23/2020 14:19    ____________________________________________   PROCEDURES  Procedure(s) performed (including Critical Care):  Reduction of dislocation  Date/Time: 04/24/2020 3:23 PM Performed by: Concha Se, MD Authorized by: Concha Se, MD  Preparation: Patient was prepped and  draped in the usual sterile fashion. Local anesthesia used: no  Anesthesia: Local anesthesia used: no  Sedation: Patient sedated: yes Sedation type: moderate (conscious) sedation Sedatives: propofol and ketamine  Patient tolerance: patient tolerated the procedure well with no immediate complications Comments: Shoulder reduction performed by myself while the sedation was performed by another provider.      ____________________________________________   INITIAL IMPRESSION / ASSESSMENT AND PLAN / ED COURSE  Jacqueline Hines was evaluated in Emergency Department on 04/23/2020 for the symptoms described in the history of present illness. She was evaluated in the context of the global COVID-19 pandemic, which necessitated consideration that the patient might be at risk for infection with the SARS-CoV-2 virus that causes COVID-19. Institutional protocols and algorithms that pertain to the evaluation of patients at risk for COVID-19 are in a state of rapid change based on information released by regulatory bodies including the CDC and federal and state organizations. These policies and algorithms were followed during the patient's care in the ED.  Patient is a 59 year old who comes in with mechanical fall onto her left shoulder.  Patient's x-ray does confirm fracture with dislocation.  Patient reports tingling sensation difficulty moving her hand.  Difficult to get a good neuro exam due to her pain.  Patient does have a palpable pulse but weaker at 1+ compared to the right..  She did not hit her head to suggest intracranial hemorrhage and denies any C-spine tenderness to suggest cervical injury.  She has no chest wall or abdominal pain to suggest other injuries here.  No other extremity injuries.  Given the associated fracture will discuss with orthopedics Dr. Franco Collet prior to reduction.  Will prepare patient for sedation.  Dr. Franco Collet agrees with this attempting reduction.  Reduction was  performed and x-ray confirmed success.  Repeat neuro exam pulse is now 2+ but she still has the same neuro exam where she is unable to flex or extend her wrist and continues to have the similar tingling throughout her entire hand.  I rediscussed with the orthopedic surgeon who stated that she might of had an injury to her nerve plexus and that sometimes it can take hours to weeks to regain function.  Her arm is in a splint which is supporting her wrist so she is in the neutral position she does not need a wrist splint while she is in the sling.  However given the slightly weaker pulse prior to reduction in her significant other palsy will get CTA to make sure no evidence of arterial injury.  Patient had off to oncoming team pending CTA.  If negative patient will be discharged home in sling with orthopedic follow-up.       ____________________________________________   FINAL CLINICAL IMPRESSION(S) / ED DIAGNOSES   Final diagnoses:  Dislocation of left shoulder joint, initial encounter  Closed displaced fracture of greater tuberosity of left humerus, initial encounter      MEDICATIONS GIVEN DURING THIS VISIT:  Medications  ketamine (KETALAR) injection (30 mg Intravenous Given 04/23/20 1456)  propofol (DIPRIVAN) 10 mg/mL bolus/IV push (30 mg Intravenous Given 04/23/20 1456)  iohexol (OMNIPAQUE) 350 MG/ML injection 100 mL (100 mLs Intravenous Contrast Given 04/23/20 1638)     ED Discharge Orders         Ordered    oxyCODONE (ROXICODONE) 5 MG immediate release tablet  Every 8 hours PRN     Discontinue  Reprint     04/23/20 1517           Note:  This document was prepared using Dragon voice recognition software and may include unintentional dictation errors.   Concha Se, MD 04/24/20 [phone]

## 2020-04-23 NOTE — ED Notes (Signed)
Pt assisted to the bathroom by this RN. Pt tolerated well. Updated patient regarding plan of care. Pt remains alert and oriented at this time. Denies further needs. Husband at bedside, call bell within reach of patient at this time.

## 2020-04-23 NOTE — Sedation Documentation (Signed)
Post reduction pt with 2+ pulses to L wrist, pt still denies being able to move her fingers at this time, baseline from pre-procedure.

## 2020-09-14 ENCOUNTER — Other Ambulatory Visit: Payer: Self-pay | Admitting: Nurse Practitioner

## 2020-09-14 DIAGNOSIS — Z1231 Encounter for screening mammogram for malignant neoplasm of breast: Secondary | ICD-10-CM

## 2020-11-19 ENCOUNTER — Other Ambulatory Visit: Payer: Self-pay

## 2020-11-19 ENCOUNTER — Ambulatory Visit
Admission: RE | Admit: 2020-11-19 | Discharge: 2020-11-19 | Disposition: A | Discharge status: Home / Self Care | Payer: Managed Care, Other (non HMO) | Source: Ambulatory Visit | Attending: Nurse Practitioner | Admitting: Nurse Practitioner

## 2020-11-19 DIAGNOSIS — Z1231 Encounter for screening mammogram for malignant neoplasm of breast: Secondary | ICD-10-CM | POA: Diagnosis present

## 2021-10-11 ENCOUNTER — Other Ambulatory Visit: Payer: Self-pay | Admitting: Nurse Practitioner

## 2021-10-11 DIAGNOSIS — Z1231 Encounter for screening mammogram for malignant neoplasm of breast: Secondary | ICD-10-CM

## 2022-04-27 ENCOUNTER — Ambulatory Visit
Admission: RE | Admit: 2022-04-27 | Discharge: 2022-04-27 | Disposition: A | Discharge status: Home / Self Care | Payer: Managed Care, Other (non HMO) | Source: Ambulatory Visit | Attending: Nurse Practitioner | Admitting: Nurse Practitioner

## 2022-04-27 DIAGNOSIS — Z1231 Encounter for screening mammogram for malignant neoplasm of breast: Secondary | ICD-10-CM | POA: Diagnosis present

## 2022-12-06 IMAGING — MG MM DIGITAL SCREENING BILAT W/ TOMO AND CAD
6 of 10 series · 6 of 30 positions shown · non-contrast
Comparison: Previous exam(s).

CLINICAL DATA: Screening.

EXAM:
DIGITAL SCREENING BILATERAL MAMMOGRAM WITH TOMO AND CAD

[R MLO synth-2D]
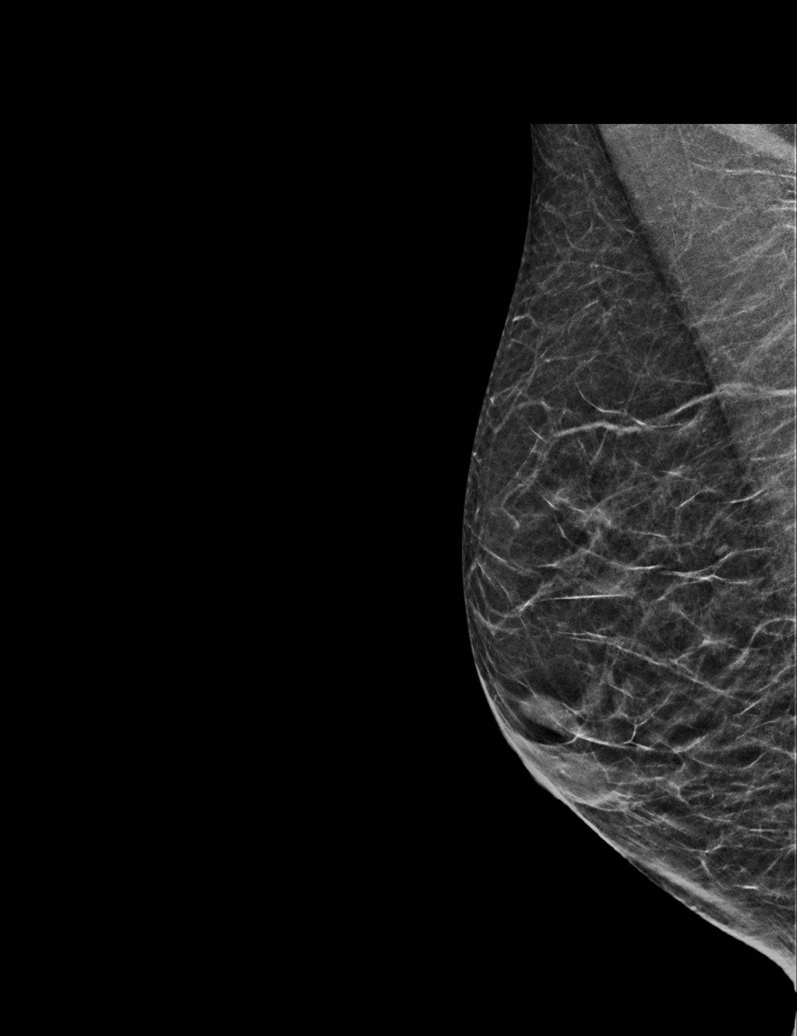

[L CC synth-2D]
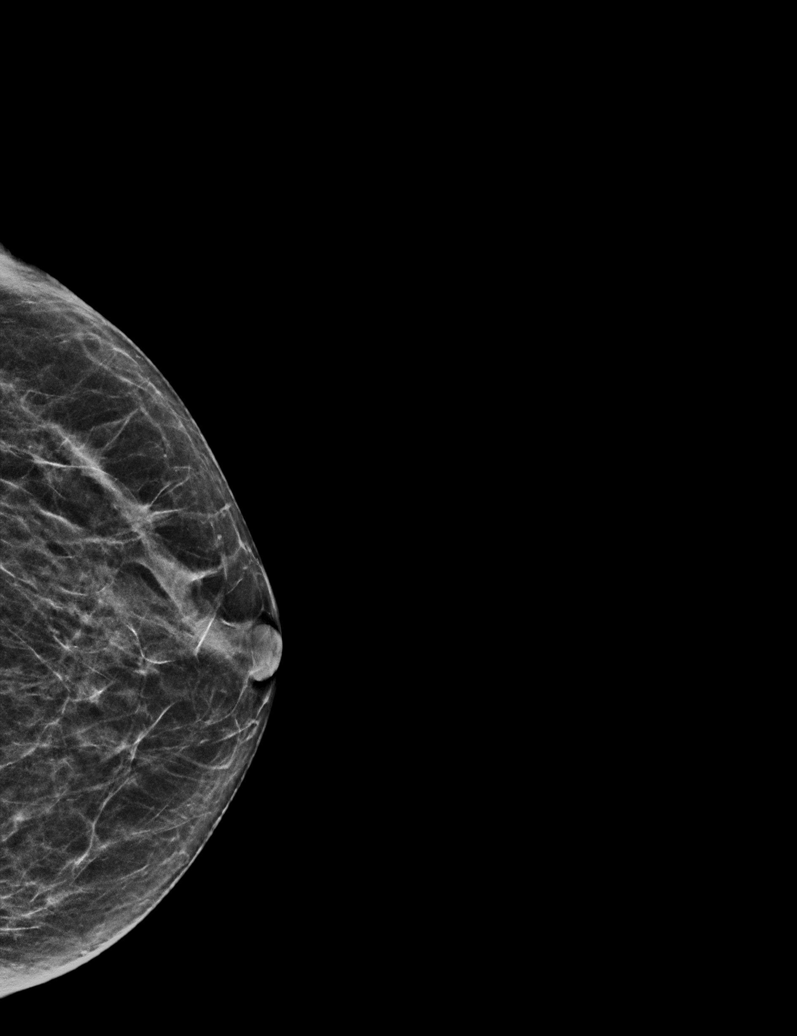

[R CC synth-2D]
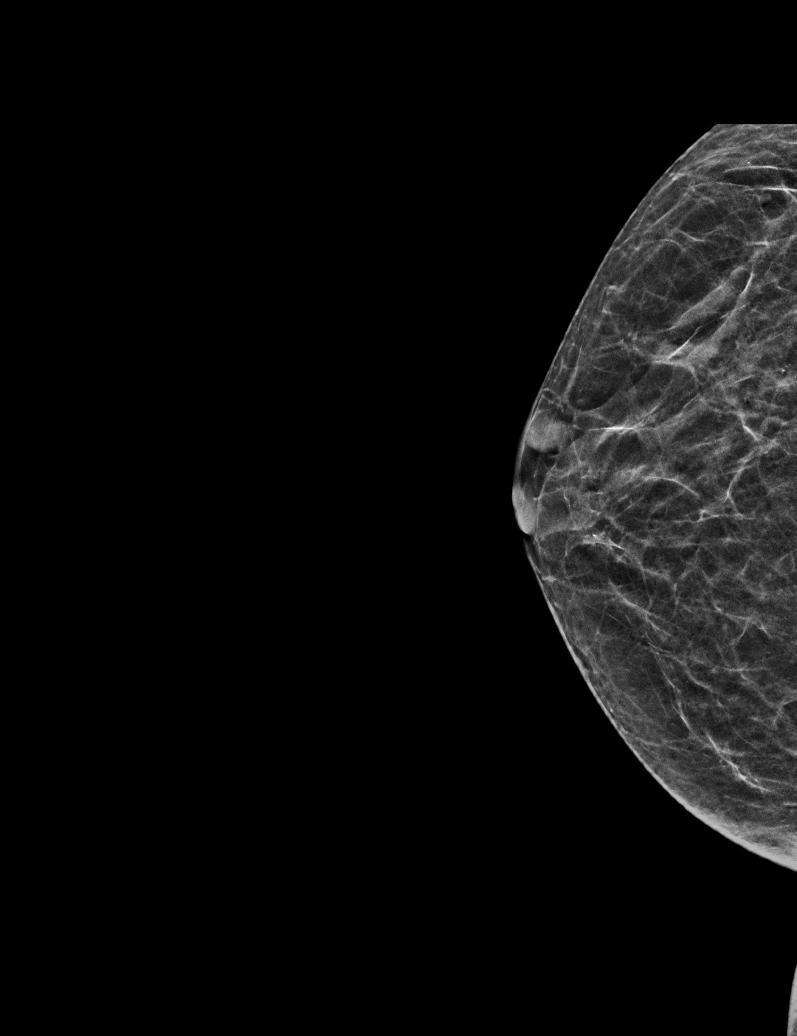

[L MLO synth-2D (1 of 2)]
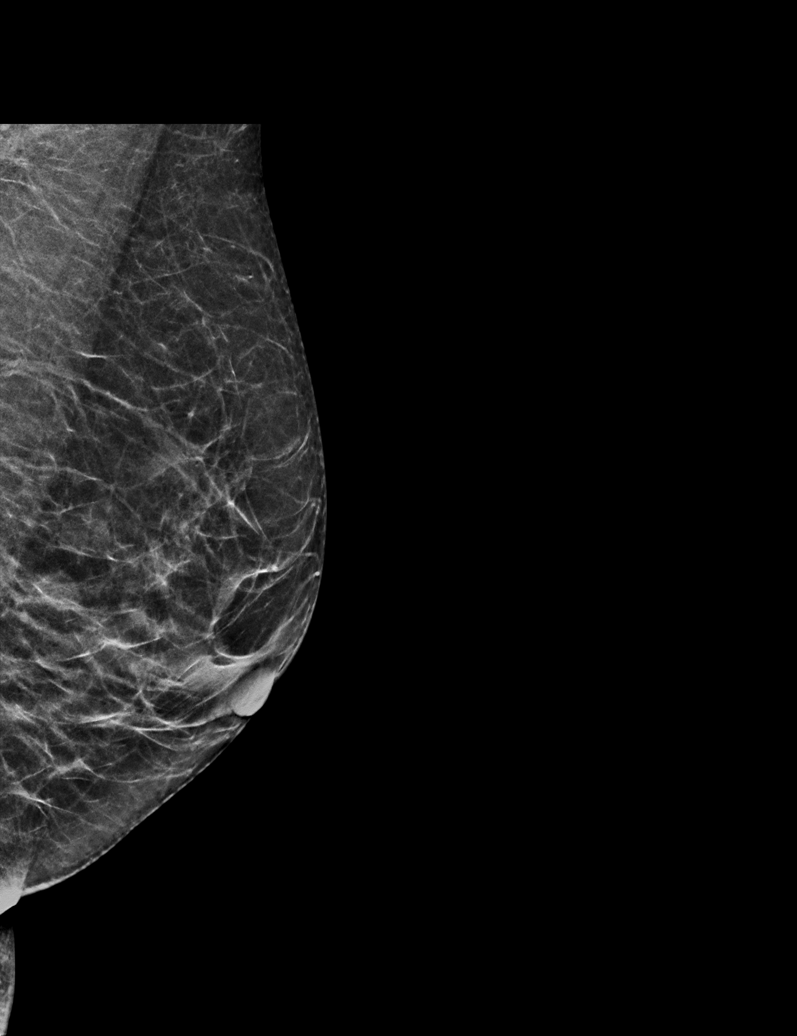

[L MLO synth-2D (2 of 2)]
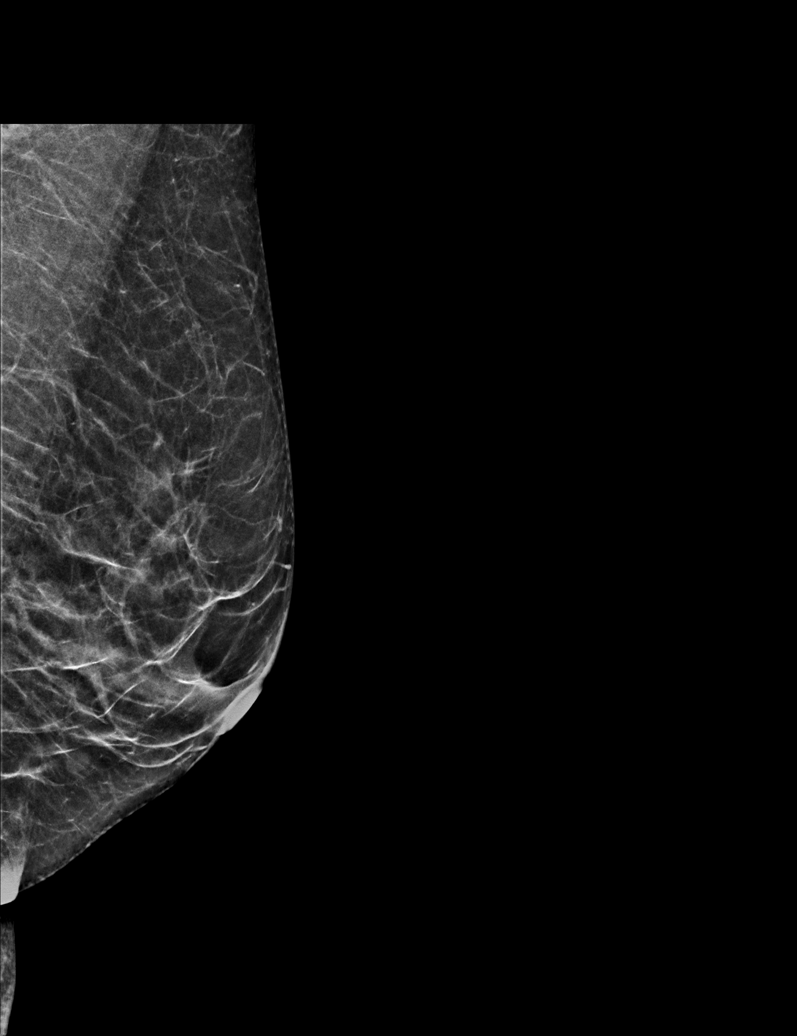

[L CC tomo · tomo slice 23/45.0]
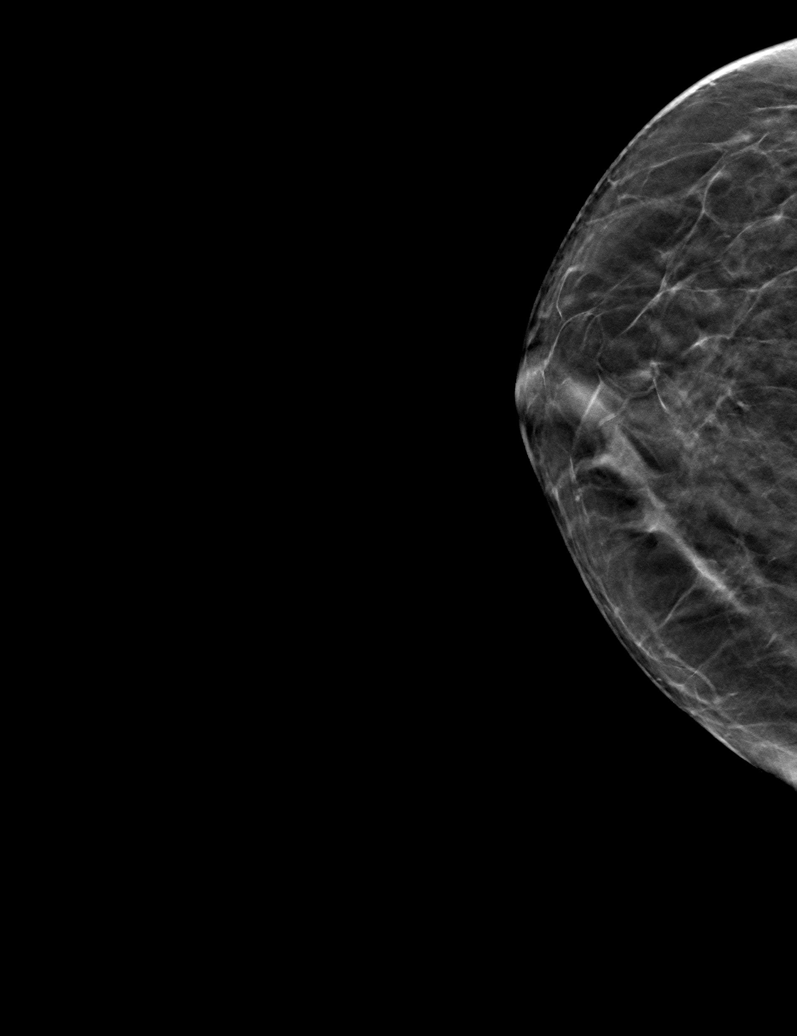

[6 of 30 positions shown; findings below may reference images not displayed]

ACR Breast Density Category b: There are scattered areas of
fibroglandular density.
FINDINGS: There are no findings suspicious for malignancy. The images were
evaluated with computer-aided detection.
IMPRESSION: No mammographic evidence of malignancy. A result letter of this
screening mammogram will be mailed directly to the patient.

RECOMMENDATION:
Screening mammogram in one year. (Code:ZP-7-VX7)

BI-RADS CATEGORY  1: Negative.

## 2023-01-01 ENCOUNTER — Other Ambulatory Visit: Payer: Self-pay | Admitting: Nurse Practitioner

## 2023-01-01 DIAGNOSIS — Z1231 Encounter for screening mammogram for malignant neoplasm of breast: Secondary | ICD-10-CM

## 2023-06-19 ENCOUNTER — Ambulatory Visit
Admission: RE | Admit: 2023-06-19 | Discharge: 2023-06-19 | Disposition: A | Discharge status: Home / Self Care | Payer: Managed Care, Other (non HMO) | Source: Ambulatory Visit | Attending: Nurse Practitioner | Admitting: Nurse Practitioner

## 2023-06-19 DIAGNOSIS — Z1231 Encounter for screening mammogram for malignant neoplasm of breast: Secondary | ICD-10-CM | POA: Insufficient documentation

## 2024-06-04 ENCOUNTER — Other Ambulatory Visit: Payer: Self-pay | Admitting: Nurse Practitioner

## 2024-06-04 DIAGNOSIS — Z1231 Encounter for screening mammogram for malignant neoplasm of breast: Secondary | ICD-10-CM

## 2024-07-30 ENCOUNTER — Ambulatory Visit
Admission: RE | Admit: 2024-07-30 | Discharge: 2024-07-30 | Disposition: A | Discharge status: Home / Self Care | Source: Ambulatory Visit | Attending: Nurse Practitioner | Admitting: Nurse Practitioner

## 2024-07-30 DIAGNOSIS — Z1231 Encounter for screening mammogram for malignant neoplasm of breast: Secondary | ICD-10-CM | POA: Insufficient documentation
